# Patient Record
Sex: Female | Born: 1975 | Race: White | Hispanic: No | Marital: Married | Smoking: Never smoker
Health system: Southern US, Community
[De-identification: ages and names within clinical notes are randomized; demographics above are authoritative.]

## PROBLEM LIST (undated history)

## (undated) DIAGNOSIS — K649 Unspecified hemorrhoids: Secondary | ICD-10-CM

---

## 2009-12-27 ENCOUNTER — Inpatient Hospital Stay (HOSPITAL_COMMUNITY): Admission: AD | Admit: 2009-12-27 | Discharge: 2009-12-27 | Payer: Self-pay | Admitting: Obstetrics & Gynecology

## 2009-12-28 ENCOUNTER — Inpatient Hospital Stay (HOSPITAL_COMMUNITY): Admission: AD | Admit: 2009-12-28 | Discharge: 2009-12-31 | Payer: Self-pay | Admitting: Obstetrics

## 2011-01-13 LAB — CBC
HCT: 33.5 % — ABNORMAL LOW (ref 36.0–46.0)
Hemoglobin: 9.5 g/dL — ABNORMAL LOW (ref 12.0–15.0)
MCHC: 32.9 g/dL (ref 30.0–36.0)
MCHC: 33 g/dL (ref 30.0–36.0)
MCV: 82.4 fL (ref 78.0–100.0)
Platelets: 127 10*3/uL — ABNORMAL LOW (ref 150–400)
Platelets: 128 10*3/uL — ABNORMAL LOW (ref 150–400)
RDW: 15.7 % — ABNORMAL HIGH (ref 11.5–15.5)
RDW: 16 % — ABNORMAL HIGH (ref 11.5–15.5)

## 2011-06-24 LAB — OB RESULTS CONSOLE HIV ANTIBODY (ROUTINE TESTING): HIV: NONREACTIVE

## 2011-09-30 ENCOUNTER — Other Ambulatory Visit (HOSPITAL_COMMUNITY): Payer: Self-pay | Admitting: Certified Nurse Midwife

## 2011-09-30 DIAGNOSIS — R772 Abnormality of alphafetoprotein: Secondary | ICD-10-CM

## 2011-09-30 DIAGNOSIS — O09529 Supervision of elderly multigravida, unspecified trimester: Secondary | ICD-10-CM

## 2011-09-30 DIAGNOSIS — Z3689 Encounter for other specified antenatal screening: Secondary | ICD-10-CM

## 2011-10-21 NOTE — L&D Delivery Note (Signed)
Delivery Note At 12:17 PM a viable and healthy female was delivered via Vaginal, Spontaneous Delivery (Presentation:LOA ).  APGAR: 9,9 ; weight pending.   Placenta status: spontaneous,intact . 3 vessel Cord:  with the following complications: none .  Cord pH: na  Anesthesia:  Local  Episiotomy: none Lacerations: second degree Suture Repair: 2.0 vicryl rapide Est. Blood Loss (mL): 300  Mom to postpartum.  Baby to nursery-stable.  Sharaya Boruff J 03/23/2012, 12:29 PM

## 2011-10-23 ENCOUNTER — Ambulatory Visit (HOSPITAL_COMMUNITY)
Admission: RE | Admit: 2011-10-23 | Discharge: 2011-10-23 | Disposition: A | Discharge status: Home / Self Care | Payer: 59 | Source: Ambulatory Visit | Attending: Certified Nurse Midwife | Admitting: Certified Nurse Midwife

## 2011-10-23 DIAGNOSIS — R772 Abnormality of alphafetoprotein: Secondary | ICD-10-CM

## 2011-10-23 DIAGNOSIS — Z3689 Encounter for other specified antenatal screening: Secondary | ICD-10-CM

## 2011-10-23 DIAGNOSIS — O289 Unspecified abnormal findings on antenatal screening of mother: Secondary | ICD-10-CM

## 2011-10-23 DIAGNOSIS — O358XX0 Maternal care for other (suspected) fetal abnormality and damage, not applicable or unspecified: Secondary | ICD-10-CM | POA: Insufficient documentation

## 2011-10-23 DIAGNOSIS — O09529 Supervision of elderly multigravida, unspecified trimester: Secondary | ICD-10-CM | POA: Insufficient documentation

## 2011-10-23 DIAGNOSIS — Z1389 Encounter for screening for other disorder: Secondary | ICD-10-CM | POA: Insufficient documentation

## 2011-10-23 DIAGNOSIS — Z363 Encounter for antenatal screening for malformations: Secondary | ICD-10-CM | POA: Insufficient documentation

## 2011-10-23 NOTE — Progress Notes (Signed)
Genetic Counseling  High-Risk Gestation Note  Appointment Date:  10/23/2011 Referred By: Marlinda Mike, CNM Date of Birth:  1976-08-03 Partner:  Melissa Macias Attending: Rica Koyanagi, MD   Ms. Melissa Macias and her husband, Mr. Melissa Macias, were seen for genetic counseling regarding a maternal age of 36 y.o. and a screen positive risk for Down syndrome based on First trimester screening performed through [facility]. The couple were accompanied by their two young children to today's visit. Ms. Melissa Macias declined the use of medical interpreter, and her husband provided interpretation for today's visit.   They were counseled regarding maternal age and the association with risk for chromosome conditions due to nondisjunction with aging of the ova.   We reviewed chromosomes, nondisjunction, and the associated 1 in 23 risk for fetal aneuploidy related to a maternal age of 21 at [redacted] weeks gestation.  They were counseled that the risk for aneuploidy decreases as gestational age increases, accounting for those pregnancies which spontaneously abort.  We specifically discussed Down syndrome (trisomy 21), trisomies 13 and 23, including the common features and prognoses of each.   We also reviewed Ms. Melissa Macias First trimester screen result and the associated decrease in risk for fetal Down syndrome (1 in 176 to 1 in 219). However, given that 1 in 219 is above the screen's cutoff, this is considered a screen positive result. They were counseled regarding other explanations for a screen positive result including normal variation and differences in maternal metabolism.  In addition, we reviewed the screen adjusted reduction in risks for trisomy 18/13 (1 in 332 to 1 in 6,621).  They understand that first trimester screening provides a pregnancy specific risk for these conditions, but is not considered to be diagnostic.    They were counseled regarding other available screening and diagnostic options including  ultrasound and amniocentesis. They were counseled that 50-80% of fetuses with Down syndrome and up to 90% of fetuses with trisomies 13 and 18, when well visualized, have detectable anomalies or soft markers by ultrasound.  The risks, benefits, and limitations of each of these options were reviewed in detail.  After thoughtful consideration of these options, they elected to proceed with ultrasound, but declined amniocentesis.  A complete detailed ultrasound was performed today.  The ultrasound report will be sent under separate cover. A choroid plexus cyst was visualized at this time. The choroid plexus is an area in the brain where cerebral spinal fluid, the fluid that bathes the brain and spinal cord, is made.  Cysts, or fluid filled sacs, are sometimes found in the choroid plexus of babies both before and after they are born.  Approximately 1% of pregnancies evaluated by ultrasound will show these cysts.  The significance of these cysts remains unclear, although it is believed that in many cases they are a normal variation of development.  It appears that there may be a slightly increased risk of a chromosome condition associated with these cysts when they are seen with other ultrasound findings.  The presence of CPCs increases the risk for Trisomy 18 above the patient's first trimester screening risk of 1 in 6,621 to approximately 1 in 736.  Follow-up ultrasound was planned in 6 weeks to reevaluate fetal anatomy. They understand that screening tests cannot rule out all birth defects or genetic syndromes.  The patient was advised of this limitation and states she still does not want diagnostic testing at this time.    Ms. Melissa Macias was provided with written information regarding cystic fibrosis (CF)  including the carrier frequency and incidence in the Caucasian population, the availability of carrier testing and prenatal diagnosis if indicated. CF is routinely screened for as part of the [place] newborn screening  panel.  She declined testing today.   Both family histories were reported to be negative for birth defects, mental retardation, and known genetic conditions. A detailed family history discussed was declined by the couple given time constraints due to the presence of both of their young children today. Without further information regarding the provided family history, an accurate genetic risk cannot be calculated. Further genetic counseling is warranted if more information is obtained.  Ms. Melissa Macias denied exposure to environmental toxins or chemical agents. She denied the use of alcohol, tobacco or street drugs. She denied significant viral illnesses during the course of her pregnancy. Her medical and surgical histories were noncontributory.     I counseled this couple regarding the above risks and available options.  The approximate face-to-face time with the genetic counselor was 20 minutes.    Quinn Plowman, MS,  Certified Genetic Counselor 10/23/2011

## 2011-10-23 NOTE — Progress Notes (Signed)
Obstetric ultrasound performed today.  Please see report in [facility]. 

## 2011-10-24 ENCOUNTER — Encounter (HOSPITAL_COMMUNITY): Payer: Self-pay

## 2011-10-28 ENCOUNTER — Other Ambulatory Visit: Payer: Self-pay

## 2011-12-04 ENCOUNTER — Ambulatory Visit (HOSPITAL_COMMUNITY)
Admission: RE | Admit: 2011-12-04 | Discharge: 2011-12-04 | Disposition: A | Discharge status: Home / Self Care | Payer: 59 | Source: Ambulatory Visit | Attending: Certified Nurse Midwife | Admitting: Certified Nurse Midwife

## 2011-12-04 DIAGNOSIS — O289 Unspecified abnormal findings on antenatal screening of mother: Secondary | ICD-10-CM

## 2011-12-04 DIAGNOSIS — O09529 Supervision of elderly multigravida, unspecified trimester: Secondary | ICD-10-CM | POA: Insufficient documentation

## 2011-12-04 DIAGNOSIS — Z3689 Encounter for other specified antenatal screening: Secondary | ICD-10-CM | POA: Insufficient documentation

## 2011-12-04 IMAGING — US US OB FOLLOW-UP
1 series · 14 of 28 positions shown · non-contrast
Comparison: none

[Series 1: us ob follow-up · 0.23mm/px · 14 of 89 slices shown]
[im 4/89]
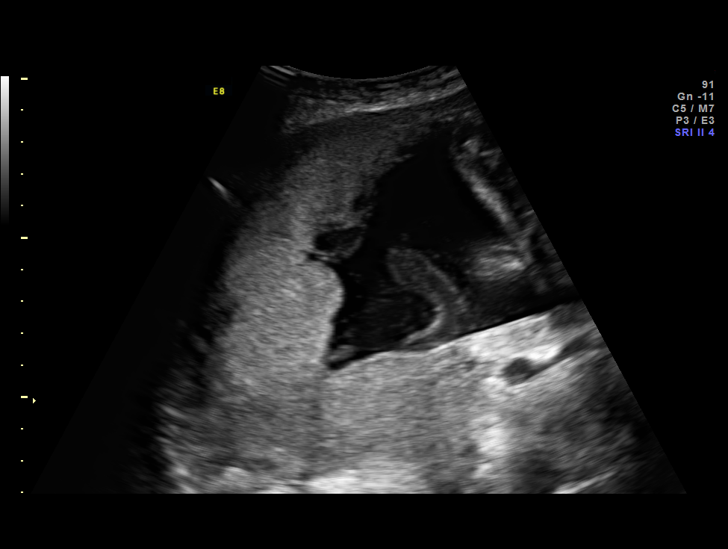
[im 10/89]
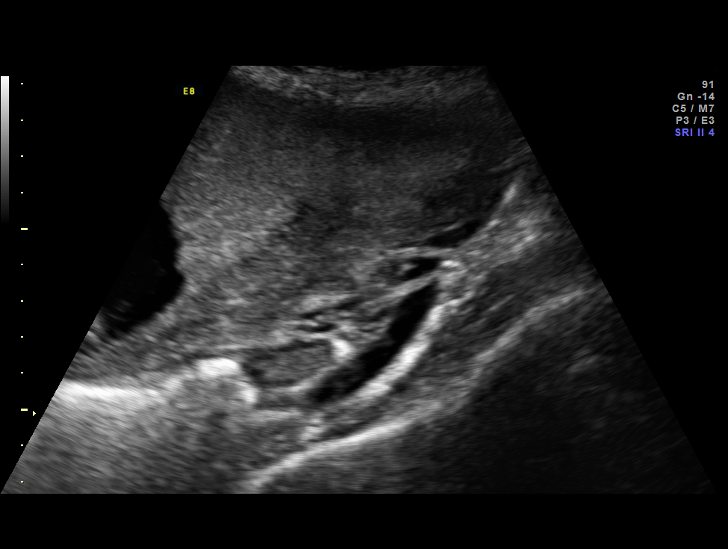
[im 17/89]
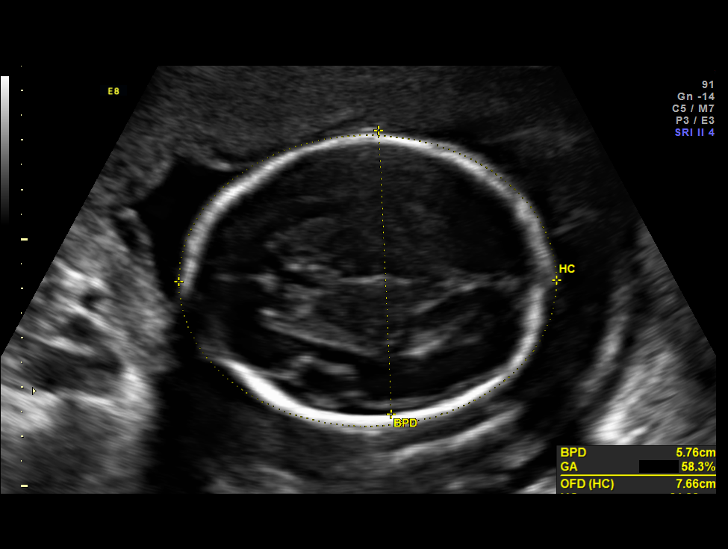
[im 23/89]
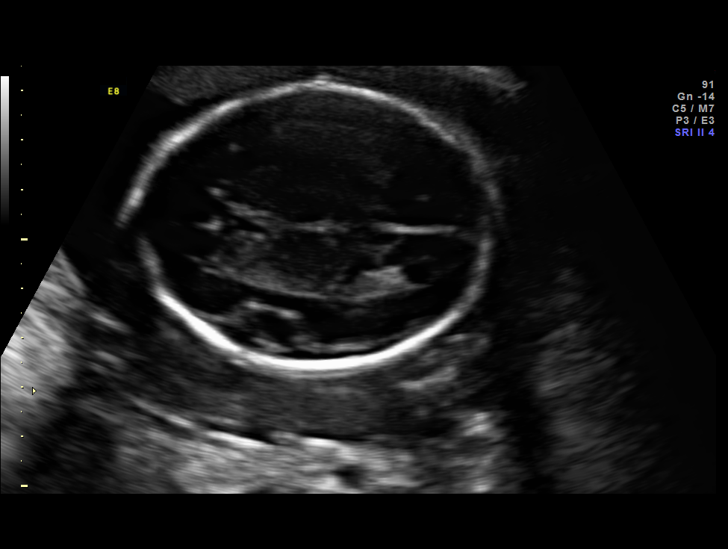
[im 30/89]
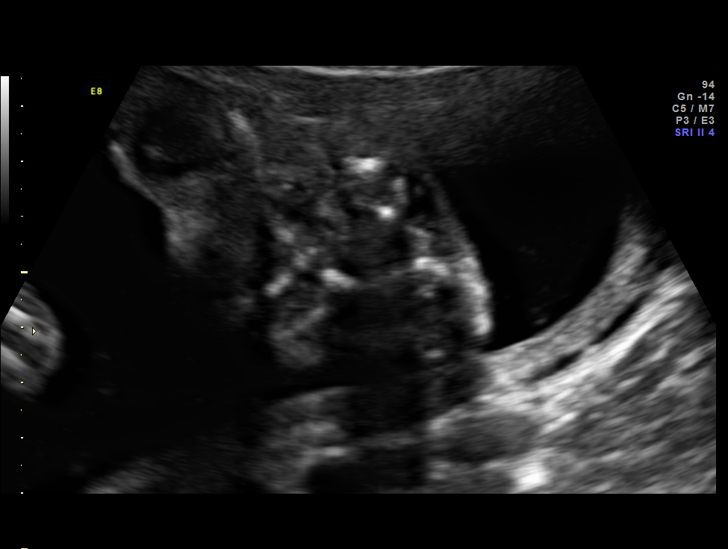
[im 36/89]
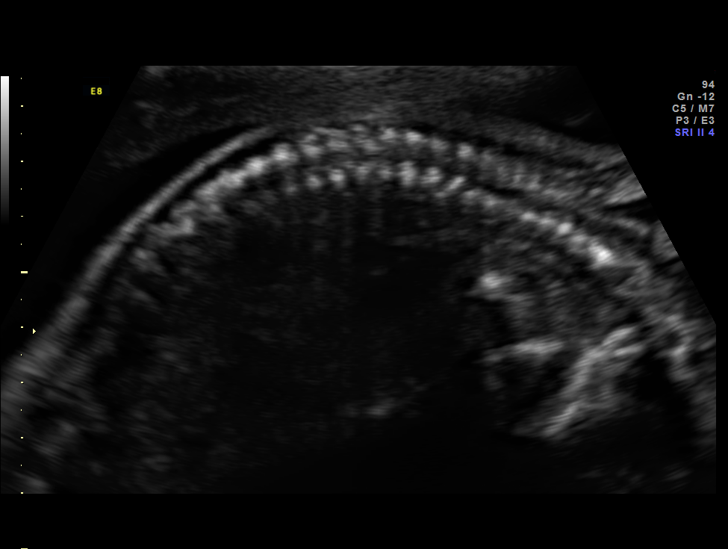
[im 43/89]
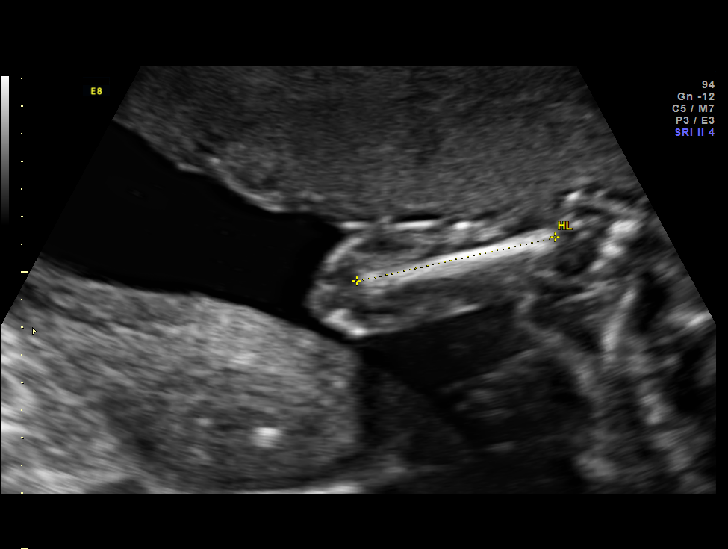
[im 49/89]
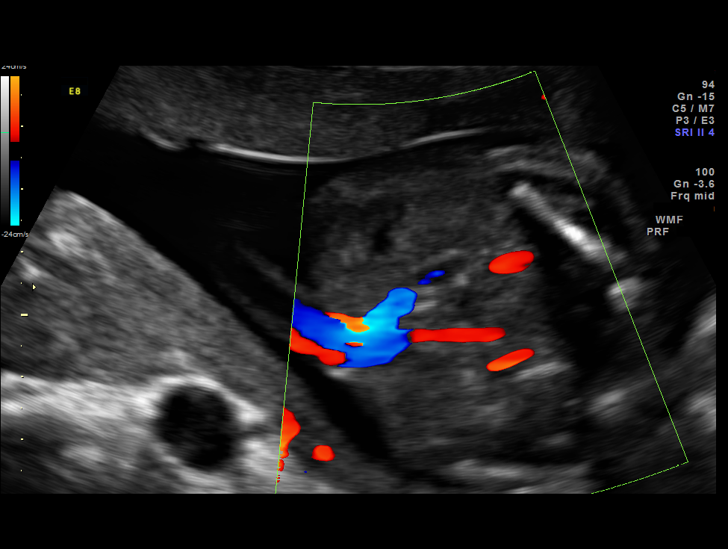
[im 56/89]
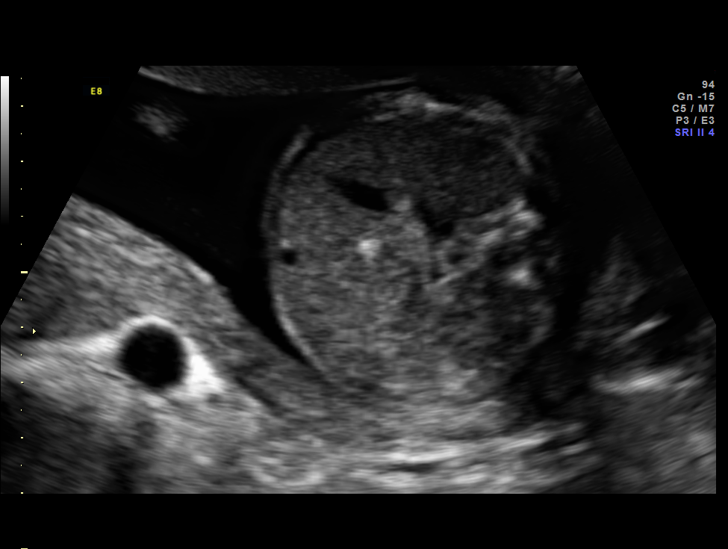
[im 62/89]
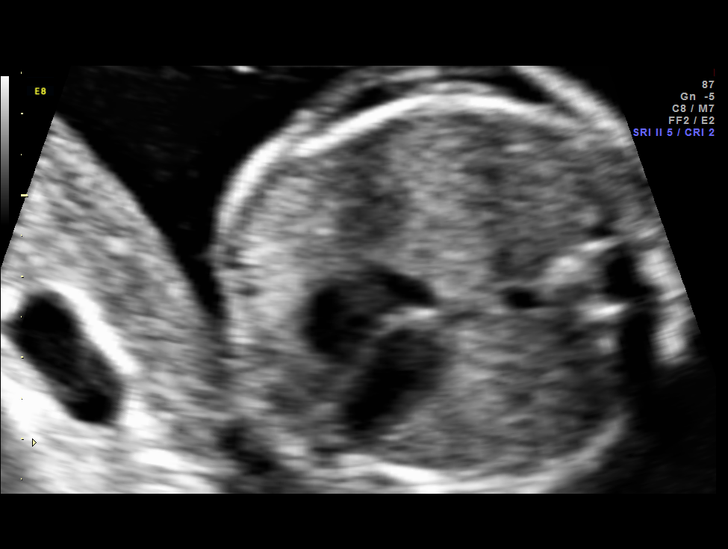
[im 69/89]
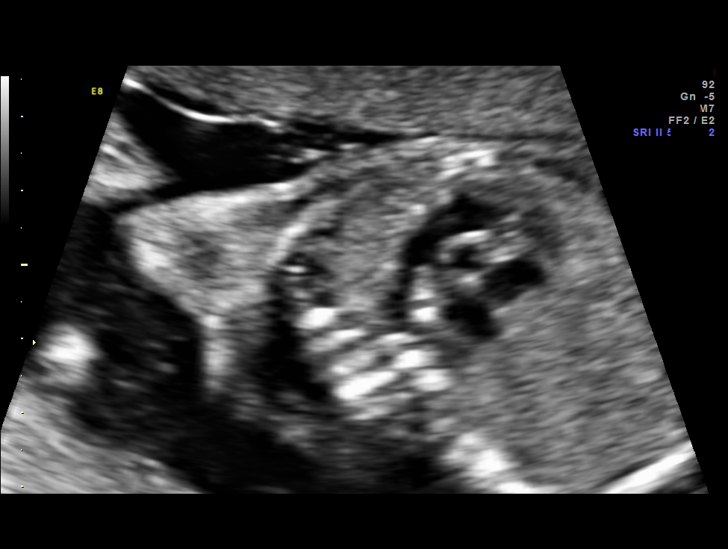
[im 75/89]
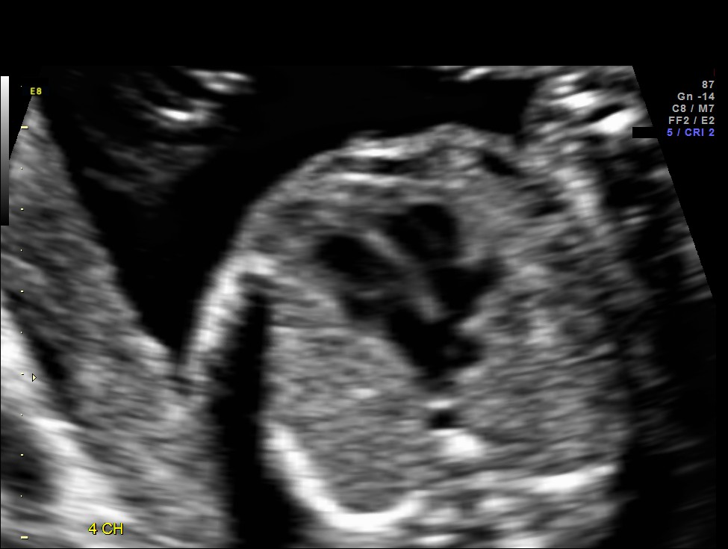
[im 82/89]
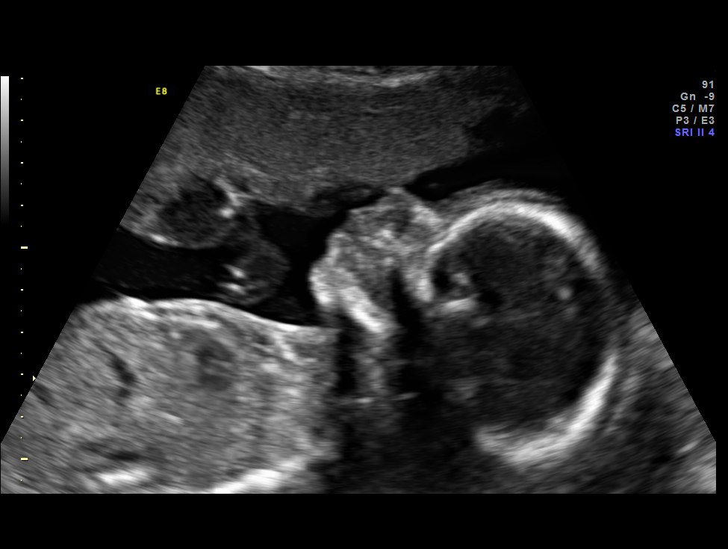
[im 89/89]
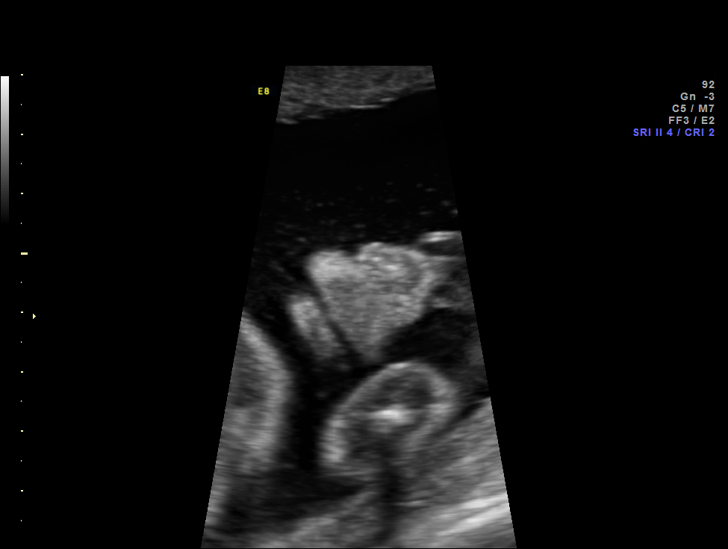

[14 of 28 positions shown; findings below may reference images not displayed]

Canned report from images found in remote index.

Refer to host system for actual result text.

## 2012-01-05 LAB — OB RESULTS CONSOLE ABO/RH: RH Type: POSITIVE

## 2012-03-23 ENCOUNTER — Encounter (HOSPITAL_COMMUNITY): Payer: Self-pay | Admitting: *Deleted

## 2012-03-23 ENCOUNTER — Inpatient Hospital Stay (HOSPITAL_COMMUNITY)
Admission: AD | Admit: 2012-03-23 | Discharge: 2012-03-24 | DRG: 775 | Disposition: A | Discharge status: Home / Self Care | Payer: 59 | Source: Ambulatory Visit | Attending: Obstetrics & Gynecology | Admitting: Obstetrics & Gynecology

## 2012-03-23 ENCOUNTER — Inpatient Hospital Stay (HOSPITAL_COMMUNITY): Admission: AD | Admit: 2012-03-23 | Payer: 59 | Source: Ambulatory Visit | Admitting: Obstetrics & Gynecology

## 2012-03-23 DIAGNOSIS — O09529 Supervision of elderly multigravida, unspecified trimester: Secondary | ICD-10-CM | POA: Diagnosis present

## 2012-03-23 DIAGNOSIS — K649 Unspecified hemorrhoids: Secondary | ICD-10-CM

## 2012-03-23 HISTORY — DX: Unspecified hemorrhoids: K64.9

## 2012-03-23 LAB — CBC
Hemoglobin: 12.4 g/dL (ref 12.0–15.0)
MCH: 29.2 pg (ref 26.0–34.0)
MCHC: 33.3 g/dL (ref 30.0–36.0)
Platelets: 155 10*3/uL (ref 150–400)
RDW: 13.7 % (ref 11.5–15.5)

## 2012-03-23 LAB — RPR: RPR Ser Ql: NONREACTIVE

## 2012-03-23 MED ORDER — WITCH HAZEL-GLYCERIN EX PADS
1.0000 "application " | MEDICATED_PAD | CUTANEOUS | Status: DC | PRN
  Administered 2012-03-23: 1 via TOPICAL

## 2012-03-23 MED ORDER — OXYCODONE-ACETAMINOPHEN 5-325 MG PO TABS
1.0000 | ORAL_TABLET | ORAL | Status: DC | PRN
  Administered 2012-03-23: 1 via ORAL
  Filled 2012-03-23: qty 1

## 2012-03-23 MED ORDER — LANOLIN HYDROUS EX OINT: TOPICAL_OINTMENT | CUTANEOUS | Status: DC | PRN

## 2012-03-23 MED ORDER — OXYTOCIN 20 UNITS IN LACTATED RINGERS INFUSION - SIMPLE: 125.0000 mL/h | INTRAVENOUS | Status: DC

## 2012-03-23 MED ORDER — TETANUS-DIPHTH-ACELL PERTUSSIS 5-2.5-18.5 LF-MCG/0.5 IM SUSP
0.5000 mL | Freq: Once | INTRAMUSCULAR | Status: AC
  Administered 2012-03-24: 0.5 mL via INTRAMUSCULAR
  Filled 2012-03-23: qty 0.5

## 2012-03-23 MED ORDER — ONDANSETRON HCL 4 MG/2ML IJ SOLN: 4.0000 mg | Freq: Four times a day (QID) | INTRAMUSCULAR | Status: DC | PRN

## 2012-03-23 MED ORDER — LIDOCAINE HCL (PF) 1 % IJ SOLN
30.0000 mL | INTRAMUSCULAR | Status: DC | PRN
  Administered 2012-03-23: 30 mL via SUBCUTANEOUS
  Filled 2012-03-23: qty 30

## 2012-03-23 MED ORDER — ONDANSETRON HCL 4 MG/2ML IJ SOLN: 4.0000 mg | INTRAMUSCULAR | Status: DC | PRN

## 2012-03-23 MED ORDER — DIBUCAINE 1 % RE OINT: 1.0000 "application " | TOPICAL_OINTMENT | RECTAL | Status: DC | PRN

## 2012-03-23 MED ORDER — SENNOSIDES-DOCUSATE SODIUM 8.6-50 MG PO TABS
2.0000 | ORAL_TABLET | Freq: Every day | ORAL | Status: DC
  Administered 2012-03-23: 2 via ORAL

## 2012-03-23 MED ORDER — SIMETHICONE 80 MG PO CHEW: 80.0000 mg | CHEWABLE_TABLET | ORAL | Status: DC | PRN

## 2012-03-23 MED ORDER — OXYCODONE-ACETAMINOPHEN 5-325 MG PO TABS: 1.0000 | ORAL_TABLET | ORAL | Status: DC | PRN

## 2012-03-23 MED ORDER — DIPHENHYDRAMINE HCL 25 MG PO CAPS: 25.0000 mg | ORAL_CAPSULE | Freq: Four times a day (QID) | ORAL | Status: DC | PRN

## 2012-03-23 MED ORDER — LACTATED RINGERS IV SOLN: 500.0000 mL | INTRAVENOUS | Status: DC | PRN

## 2012-03-23 MED ORDER — PRENATAL MULTIVITAMIN CH
1.0000 | ORAL_TABLET | Freq: Every day | ORAL | Status: DC
  Administered 2012-03-24: 1 via ORAL
  Filled 2012-03-23: qty 1

## 2012-03-23 MED ORDER — IBUPROFEN 600 MG PO TABS
600.0000 mg | ORAL_TABLET | Freq: Four times a day (QID) | ORAL | Status: DC | PRN
  Administered 2012-03-23: 600 mg via ORAL
  Filled 2012-03-23: qty 1

## 2012-03-23 MED ORDER — BENZOCAINE-MENTHOL 20-0.5 % EX AERO
1.0000 "application " | INHALATION_SPRAY | CUTANEOUS | Status: DC | PRN
  Administered 2012-03-23: 1 via TOPICAL
  Filled 2012-03-23: qty 56

## 2012-03-23 MED ORDER — FLEET ENEMA 7-19 GM/118ML RE ENEM: 1.0000 | ENEMA | RECTAL | Status: DC | PRN

## 2012-03-23 MED ORDER — IBUPROFEN 600 MG PO TABS
600.0000 mg | ORAL_TABLET | Freq: Four times a day (QID) | ORAL | Status: DC
  Administered 2012-03-23 – 2012-03-24 (×4): 600 mg via ORAL
  Filled 2012-03-23 (×4): qty 1

## 2012-03-23 MED ORDER — OXYTOCIN 20 UNITS IN LACTATED RINGERS INFUSION - SIMPLE
125.0000 mL/h | Freq: Once | INTRAVENOUS | Status: AC
  Administered 2012-03-23: 125 mL/h via INTRAVENOUS

## 2012-03-23 MED ORDER — ACETAMINOPHEN 325 MG PO TABS: 650.0000 mg | ORAL_TABLET | ORAL | Status: DC | PRN

## 2012-03-23 MED ORDER — ZOLPIDEM TARTRATE 5 MG PO TABS: 5.0000 mg | ORAL_TABLET | Freq: Every evening | ORAL | Status: DC | PRN

## 2012-03-23 MED ORDER — OXYTOCIN BOLUS FROM INFUSION
500.0000 mL | Freq: Once | INTRAVENOUS | Status: DC
  Filled 2012-03-23: qty 1000
  Filled 2012-03-23: qty 500

## 2012-03-23 MED ORDER — ONDANSETRON HCL 4 MG PO TABS: 4.0000 mg | ORAL_TABLET | ORAL | Status: DC | PRN

## 2012-03-23 MED ORDER — METHYLERGONOVINE MALEATE 0.2 MG PO TABS: 0.2000 mg | ORAL_TABLET | ORAL | Status: DC | PRN

## 2012-03-23 MED ORDER — METHYLERGONOVINE MALEATE 0.2 MG/ML IJ SOLN: 0.2000 mg | INTRAMUSCULAR | Status: DC | PRN

## 2012-03-23 MED ORDER — LACTATED RINGERS IV SOLN
INTRAVENOUS | Status: DC
  Administered 2012-03-23: 125 mL/h via INTRAVENOUS

## 2012-03-23 MED ORDER — CITRIC ACID-SODIUM CITRATE 334-500 MG/5ML PO SOLN: 30.0000 mL | ORAL | Status: DC | PRN

## 2012-03-23 NOTE — Progress Notes (Signed)
Kacey Dysert is a 36 y.o. G3P2002 at [redacted]w[redacted]d by LMP admitted for active labor  Subjective: uncomfortable  Objective: BP 109/70  Pulse 79  Temp(Src) 98.3 F (36.8 C) (Oral)  Resp 18  Ht 5\' 4"  (1.626 m)  Wt 63.957 kg (141 lb)  BMI 24.20 kg/m2  LMP 06/24/2011      FHT:  FHR: 155 bpm, variability: moderate,  accelerations:  Present,  decelerations:  Absent UC:   regular, every 38/100/0 minutes SVE:    8/100/0 Clear AF noted.  Labs: Lab Results  Component Value Date   WBC 9.1 03/23/2012   HGB 12.4 03/23/2012   HCT 37.2 03/23/2012   MCV 87.7 03/23/2012   PLT 155 03/23/2012    Assessment / Plan: Spontaneous labor, progressing normally  Labor: Progressing normally Preeclampsia:  na Fetal Wellbeing:  Category I Pain Control:  Labor support without medications I/D:  n/a Anticipated MOD:  NSVD  Darbie Biancardi J 03/23/2012, 11:35 AM

## 2012-03-23 NOTE — H&P (Signed)
NAME:  Melissa Macias, Melissa Macias NO.:  [id number]  MEDICAL RECORD NO.:  [id number]  LOCATION:  9161                          FACILITY:  [facility]  PHYSICIAN:  Lenoard Aden, M.D.DATE OF BIRTH:  [birth year]  DATE OF ADMISSION:  03/23/2012 DATE OF DISCHARGE:                             HISTORY & PHYSICAL   CHIEF COMPLAINT:  A 36 year old, aerobic female, G3, P2, at 105 weeks' and active labor.  She is a nonsmoker, nondrinker.  She denies domestic or physical violence.  MEDICATIONS:  Include prenatal vitamins.  ALLERGIES:  She has no known drug allergies.  PAST SURGICAL HISTORY:  She has a history of vaginal delivery x2.  FAMILY HISTORY:  Noncontributory.  PRENATAL COURSE:  Uncomplicated.  PHYSICAL EXAMINATION:  GENERAL:  Well-developed, well-nourished, female in no acute distress. HEENT:  Normal. NECK:  Supple.  Full range of motion. LUNGS:  Clear. HEART:  Regular rhythm. ABDOMEN:  Soft, gravid, nontender.  No CVA tenderness. EXTREMITIES:  No cords. NEUROLOGIC:  Nonfocal. SKIN:  Intact. CERVIX:  8 cm, 70% vertex, 0 to +1 station.  Clear fluid noted.  NST is reactive.  IMPRESSION:  Term intrauterine pregnancy in active labor.  PLAN:  Anticipate attempts at vaginal delivery.     Lenoard Aden, M.D.     RJT/MEDQ  D:  03/23/2012  T:  03/23/2012  Job:  [id number]

## 2012-03-24 ENCOUNTER — Encounter (HOSPITAL_COMMUNITY): Payer: Self-pay

## 2012-03-24 LAB — CBC
MCH: 29.1 pg (ref 26.0–34.0)
MCHC: 32.6 g/dL (ref 30.0–36.0)
Platelets: 150 10*3/uL (ref 150–400)
RBC: 3.82 MIL/uL — ABNORMAL LOW (ref 3.87–5.11)

## 2012-03-24 MED ORDER — IBUPROFEN 600 MG PO TABS: 600.0000 mg | ORAL_TABLET | Freq: Four times a day (QID) | ORAL | Status: AC

## 2012-03-24 MED ORDER — PRENATAL MULTIVITAMIN CH: 1.0000 | ORAL_TABLET | Freq: Every day | ORAL | Status: DC

## 2012-03-24 NOTE — Progress Notes (Addendum)
PPD 1 SVD  S:  Reports feeling well.             Tolerating po/ No nausea or vomiting             Bleeding is light             Pain controlled with Motrin             Up ad lib / ambulatory / voiding well.   Newborn  Information for the patient's newborn:  Camie, Hauss [[id number]]  female  breast feeding    O:  A & O x 3 NAD             VS:  Filed Vitals:   03/23/12 1500 03/23/12 1615 03/23/12 2012 03/24/12 0430  BP: 92/56 104/62 100/64 98/60  Pulse: 81 74 78 78  Temp: 98.8 F (37.1 C) 98.4 F (36.9 C) 98.6 F (37 C) 98.1 F (36.7 C)  TempSrc: Oral Oral Oral Oral  Resp: 16 18 16 16   Height:      Weight:      SpO2:   98% 98%    LABS:  Basename 03/24/12 0523 03/23/12 1130  WBC 9.9 9.1  HGB 11.1* 12.4  HCT 34.0* 37.2  PLT 150 155    Blood type: --/--/O POS (06/04 1130)  Rubella: Immune (03/18 0000)     Lungs: Clear and unlabored  Heart: regular rate and rhythm / no murmurs  Abdomen: soft, non-tender, non-distended              Fundus: firm, non-tender, U -1  Perineum: repair intact  Lochia: small  Extremities: no edema, no calf pain or tenderness, neg Homans    A/P: PPD # 1 35 y.o., [id number]    Principal Problem:  *PP - NVB 6/4   Doing well - stable status  Routine post partum orders  Desires early DC / will DC home w/ WOB instructions   Melissa Macias, CNM, MSN 03/24/2012, 10:15 AM

## 2012-03-24 NOTE — Discharge Summary (Signed)
Obstetric Discharge Summary Reason for Admission: advanced dilation 6 cm Prenatal Procedures: ultrasound Intrapartum Procedures: spontaneous vaginal delivery Postpartum Procedures: none Complications-Operative and Postpartum: 2nd degree perineal laceration Hemoglobin  Date Value Range Status  03/24/2012 11.1* 12.0-15.0 (g/dL) Final     HCT  Date Value Range Status  03/24/2012 34.0* 36.0-46.0 (%) Final    Physical Exam:  General: alert, cooperative and no distress Lochia: appropriate Uterine Fundus: firm Incision: healing well DVT Evaluation: Negative Homan's sign. No cords or calf tenderness. No significant calf/ankle edema.  Discharge Diagnoses: Term Pregnancy-delivered  Discharge Information: Date: 03/24/2012 Activity: pelvic rest Diet: routine Medications: PNV and Ibuprofen Condition: stable Instructions: refer to practice specific booklet Discharge to: home Follow-up Information    Follow up with LAVOIE,MARIE-LYNE, MD in 6 weeks.   Contact information:   [address] [place] [address] [phone]          Newborn Data: Live born female "Rahaf" Birth Weight: 7 lb 11.2 oz (3493 g) APGAR: 9, 9  Home with mother.  Talma Aguillard 03/24/2012, 10:35 AM

## 2012-07-06 ENCOUNTER — Emergency Department (HOSPITAL_COMMUNITY): Payer: No Typology Code available for payment source

## 2012-07-06 ENCOUNTER — Emergency Department (HOSPITAL_COMMUNITY)
Admission: EM | Admit: 2012-07-06 | Discharge: 2012-07-06 | Disposition: A | Discharge status: Home / Self Care | Payer: No Typology Code available for payment source | Attending: Emergency Medicine | Admitting: Emergency Medicine

## 2012-07-06 ENCOUNTER — Encounter (HOSPITAL_COMMUNITY): Payer: Self-pay | Admitting: *Deleted

## 2012-07-06 DIAGNOSIS — R079 Chest pain, unspecified: Secondary | ICD-10-CM | POA: Insufficient documentation

## 2012-07-06 DIAGNOSIS — Y93I9 Activity, other involving external motion: Secondary | ICD-10-CM | POA: Insufficient documentation

## 2012-07-06 DIAGNOSIS — T148XXA Other injury of unspecified body region, initial encounter: Secondary | ICD-10-CM

## 2012-07-06 DIAGNOSIS — Y998 Other external cause status: Secondary | ICD-10-CM | POA: Insufficient documentation

## 2012-07-06 DIAGNOSIS — M542 Cervicalgia: Secondary | ICD-10-CM

## 2012-07-06 DIAGNOSIS — IMO0002 Reserved for concepts with insufficient information to code with codable children: Secondary | ICD-10-CM | POA: Insufficient documentation

## 2012-07-06 DIAGNOSIS — M25579 Pain in unspecified ankle and joints of unspecified foot: Secondary | ICD-10-CM | POA: Insufficient documentation

## 2012-07-06 DIAGNOSIS — M25572 Pain in left ankle and joints of left foot: Secondary | ICD-10-CM

## 2012-07-06 DIAGNOSIS — R0789 Other chest pain: Secondary | ICD-10-CM

## 2012-07-06 MED ORDER — IBUPROFEN 800 MG PO TABS: 800.0000 mg | ORAL_TABLET | Freq: Three times a day (TID) | ORAL | Status: DC | PRN
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
## 2012-07-07 NOTE — ED Provider Notes (Signed)
Medical screening examination/treatment/procedure(s) were performed by non-physician practitioner and as supervising physician I was immediately available for consultation/collaboration.  Treasure Ingrum, MD 07/07/12 0029 

## 2014-08-21 ENCOUNTER — Encounter (HOSPITAL_COMMUNITY): Payer: Self-pay | Admitting: *Deleted

## 2017-05-12 ENCOUNTER — Encounter: Payer: Self-pay | Admitting: Obstetrics & Gynecology

## 2017-05-12 ENCOUNTER — Ambulatory Visit (INDEPENDENT_AMBULATORY_CARE_PROVIDER_SITE_OTHER): Payer: Self-pay | Admitting: Obstetrics & Gynecology

## 2017-05-12 VITALS — BP 128/82 | Ht 63.0 in | Wt 142.0 lb

## 2017-05-12 DIAGNOSIS — N921 Excessive and frequent menstruation with irregular cycle: Secondary | ICD-10-CM

## 2017-05-12 DIAGNOSIS — R350 Frequency of micturition: Secondary | ICD-10-CM

## 2017-05-12 LAB — CBC
HCT: 41.9 % (ref 35.0–45.0)
HEMOGLOBIN: 13.9 g/dL (ref 11.7–15.5)
MCH: 29.2 pg (ref 27.0–33.0)
MCHC: 33.2 g/dL (ref 32.0–36.0)
MCV: 88 fL (ref 80.0–100.0)
MPV: 11.3 fL (ref 7.5–12.5)
Platelets: 217 10*3/uL (ref 140–400)
RBC: 4.76 MIL/uL (ref 3.80–5.10)
RDW: 12.7 % (ref 11.0–15.0)
WBC: 5.3 10*3/uL (ref 3.8–10.8)

## 2017-05-12 LAB — URINALYSIS W MICROSCOPIC + REFLEX CULTURE
Bilirubin Urine: NEGATIVE
CASTS: NONE SEEN [LPF]
Glucose, UA: NEGATIVE
Hgb urine dipstick: NEGATIVE
KETONES UR: NEGATIVE
Leukocytes, UA: NEGATIVE
NITRITE: NEGATIVE
Protein, ur: NEGATIVE
RBC / HPF: NONE SEEN RBC/HPF (ref <=2)
Specific Gravity, Urine: 1.02 (ref 1.001–1.035)
YEAST: NONE SEEN [HPF]
pH: 8.5 — ABNORMAL HIGH (ref 5.0–8.0)

## 2017-05-12 LAB — TSH: TSH: 2.18 m[IU]/L

## 2017-05-12 LAB — PREGNANCY, URINE: Preg Test, Ur: NEGATIVE

## 2017-05-12 NOTE — Progress Notes (Signed)
    Melissa LivingFatna Debruhl 1975-11-14 [id number]        41 y.o.  [id number] Married.  From [place].  RP:  Menometrorrhagia with pelvic pain x 3 months  HPI:  Menses heavier, longer and irregular since 01/2017.  No contraception, ok if conceives.  Consulted in [place], but per patient, just had a physical exam, no testing done.  Treated with Amoxyl.  No improvement.  No vaginal d/c.  No fever.  No UTI Sx, except some frequency.  BMs wnl.  Last Pap and Mammo 2 yrs ago.    Past medical history,surgical history, problem list, medications, allergies, family history and social history were all reviewed and documented in the EPIC chart.  Directed ROS with pertinent positives and negatives documented in the history of present illness/assessment and plan.  Exam:  Vitals:   05/12/17 1521  BP: 128/82  Weight: 142 lb (64.4 kg)  Height: 5\' 3"  (1.6 m)   General appearance:  Normal  Abdo:  Soft, NT, no distention  Gyn exam:  Vulva normal                    Speculum:  Normal vaginal secretions and cervix.  Pap reflex/Gono-Chlam done.                    Bimanual exam:  Uterus AV, normal volume, NT, mobile.  No adnexal mass, slightly tender.   Assessment/Plan:  41 y.o. G3P3003   1. Menometrorrhagia Menometro with lower abdominal pain/tenderness x 3 months - Pap IG, CT/NG w/ reflex HPV when ASC-U - CBC - TSH - Prolactin - Pregnancy, urine - US Transvaginal Non-OB; Future  2. Frequency of urination U/A Nit neg, pending Culture. - Urinalysis with Culture Reflex  F/U Pelvic US and Annual/Gyn exam.  Counseling on above issues >50% x 30 minutes.  Genia DelMarie-Lyne Melissa Rosinski MD, 3:31 PM 05/12/2017

## 2017-05-12 NOTE — Patient Instructions (Signed)
1. Menometrorrhagia Menometro with lower abdominal pain/tenderness x 3 months - Pap IG, CT/NG w/ reflex HPV when ASC-U - CBC - TSH - Prolactin - Pregnancy, urine - US Transvaginal Non-OB; Future  2. Frequency of urination U/A Nit neg, pending Culture. - Urinalysis with Culture Reflex  F/U Pelvic US and Annual/Gyn exam  Tashauna, ce fut un plaisir de vous revoir aujourd'hui!  Je vous transmettrai vos resultats aussitot que disponibles.

## 2017-05-13 LAB — PROLACTIN: PROLACTIN: 12.5 ng/mL

## 2017-05-14 LAB — PAP IG, CT-NG, RFX HPV ASCU
CHLAMYDIA PROBE AMP: NOT DETECTED
GC PROBE AMP: NOT DETECTED

## 2017-05-14 LAB — URINE CULTURE: ORGANISM ID, BACTERIA: NO GROWTH

## 2017-05-25 ENCOUNTER — Encounter: Payer: Self-pay | Admitting: Obstetrics & Gynecology

## 2017-05-25 ENCOUNTER — Other Ambulatory Visit: Payer: Self-pay | Admitting: Obstetrics & Gynecology

## 2017-05-25 ENCOUNTER — Ambulatory Visit (INDEPENDENT_AMBULATORY_CARE_PROVIDER_SITE_OTHER): Payer: Self-pay

## 2017-05-25 ENCOUNTER — Ambulatory Visit (INDEPENDENT_AMBULATORY_CARE_PROVIDER_SITE_OTHER): Payer: Self-pay | Admitting: Obstetrics & Gynecology

## 2017-05-25 VITALS — BP 108/66

## 2017-05-25 DIAGNOSIS — R102 Pelvic and perineal pain: Secondary | ICD-10-CM

## 2017-05-25 DIAGNOSIS — N921 Excessive and frequent menstruation with irregular cycle: Secondary | ICD-10-CM

## 2017-05-25 DIAGNOSIS — R938 Abnormal findings on diagnostic imaging of other specified body structures: Secondary | ICD-10-CM

## 2017-05-25 DIAGNOSIS — R9389 Abnormal findings on diagnostic imaging of other specified body structures: Secondary | ICD-10-CM

## 2017-05-25 LAB — PREGNANCY, URINE: Preg Test, Ur: NEGATIVE

## 2017-05-25 NOTE — Progress Notes (Signed)
    Melissa Macias Dec 21, 1975 [id number]        41 y.o.  [id number]  Still breastfeeding youngest baby.  No contraception, ok if conceives.  RP:  Menometrorrhagia with mild pelvic pain for Pelvic US today.  HPI:  Currently well, not bleeding and no pelvic pain.  Seen on 7/24th:  U. Culture neg.  Prl, TSH wnl.  UPT neg.  Pap normal.  Gono-Chlam neg.  Past medical history,surgical history, problem list, medications, allergies, family history and social history were all reviewed and documented in the EPIC chart.  Directed ROS with pertinent positives and negatives documented in the history of present illness/assessment and plan.  Exam:  Vitals:   05/25/17 1255  BP: 108/66   General appearance:  Normal  Pelvic US today:  T/V Anteverted Uterus with fluid within the Endometrium 6 x 2 x 6 mm.  Small echogenic focus in Endometrium 1.7 cm, neg CFD.  Endometrial line otherwise 7.3 mm.  Posterior Subserous Fibroid 3.2 x 2.9 x 3.4 cm.  Tight and Left Ovary normal.  No Cystic areas Right and Left Adnexae.  No FF in CDS.  UPT neg today again.  Assessment/Plan:  41 y.o. G3P3003  1. Menometrorrhagia Patient is breastfeeding.  Work-up negative with Prl/TSH wnl, Gono-Chlam neg, Pap wnl.  Pelvic US today reassuring, except for a possible Endometrial Polyp.  F/U Sonohysto. UPT today neg again.  2. Female pelvic pain No current pelvic pain.  Pelvic US with normal Ovaries.  Counseling >50% x 15 minutes.  Melissa DelMarie-Lyne Thressa Shiffer MD, 12:58 PM 05/25/2017

## 2017-05-25 NOTE — Patient Instructions (Signed)
1. Menometrorrhagia Patient is breastfeeding.  Work-up negative with Prl/TSH wnl, Gono-Chlam neg, Pap wnl.  Pelvic US today reassuring, except for a possible Endometrial Polyp.  F/U Sonohysto. UPT today neg again.  2. Female pelvic pain No current pelvic pain.  Pelvic US with normal Ovaries.  Melissa LivingFatna, it was a pleasure to see you today and I will see you soon for the Sonohysto.

## 2017-05-25 NOTE — Addendum Note (Signed)
Addended by: Berna SpareASTILLO, BLANCA A on: 05/25/2017 03:55 PM   Modules accepted: Orders

## 2017-06-03 ENCOUNTER — Ambulatory Visit: Payer: Self-pay | Admitting: Obstetrics & Gynecology

## 2017-06-03 ENCOUNTER — Other Ambulatory Visit: Payer: Self-pay

## 2021-04-16 ENCOUNTER — Ambulatory Visit: Payer: Self-pay | Admitting: Obstetrics & Gynecology

## 2021-04-30 ENCOUNTER — Encounter: Payer: Self-pay | Admitting: Obstetrics & Gynecology

## 2021-04-30 ENCOUNTER — Ambulatory Visit (INDEPENDENT_AMBULATORY_CARE_PROVIDER_SITE_OTHER): Payer: Self-pay | Admitting: Obstetrics & Gynecology

## 2021-04-30 ENCOUNTER — Other Ambulatory Visit: Payer: Self-pay

## 2021-04-30 VITALS — BP 104/64 | HR 77 | Resp 16 | Ht 63.25 in | Wt 153.0 lb

## 2021-04-30 DIAGNOSIS — Z30011 Encounter for initial prescription of contraceptive pills: Secondary | ICD-10-CM

## 2021-04-30 DIAGNOSIS — N840 Polyp of corpus uteri: Secondary | ICD-10-CM

## 2021-04-30 MED ORDER — NORETHIN ACE-ETH ESTRAD-FE 1-20 MG-MCG PO TABS: 1.0000 | ORAL_TABLET | Freq: Every day | ORAL | 4 refills | Status: DC

## 2021-04-30 NOTE — Progress Notes (Signed)
    Melissa Macias 09-25-1976 [id number]        45 y.o.  [id number]   RP: H/O Endometrial Polyp/contraception  HPI: Pelvic US showed a probable endometrial polyp in 2018.  Had a HSC/Excision of Polyp/D+C in [place] in 2021, patho showed benign endometrial polyp and endometrium.  Menses regular normal every month.  No BTB since the [facility] in 2021.  No pelvic pain.  Currently not on any contraceptive.   OB History  Gravida Para Term Preterm AB Living  4 3 3  0 0 4  SAB IAB Ectopic Multiple Live Births  0 0 0 0 4    # Outcome Date GA Lbr Len/2nd Weight Sex Delivery Anes PTL Lv  4 Term 03/23/12 [redacted]w[redacted]d 08:15 / 00:02 7 lb 11.2 oz (3.493 kg) F Vag-Spont Local  LIV  3 Gravida           2 Term           1 Term             Past medical history,surgical history, problem list, medications, allergies, family history and social history were all reviewed and documented in the EPIC chart.   Directed ROS with pertinent positives and negatives documented in the history of present illness/assessment and plan.  Exam:  Vitals:   04/30/21 0842  BP: 104/64  Pulse: 77  Resp: 16  Weight: 153 lb (69.4 kg)  Height: 5' 3.25" (1.607 m)   General appearance:  Normal  Abdomen: Normal  Gynecologic exam: Vulva normal.  Bimanual exam:  Cervix/vagina normal.  Uterus AV, mobile, normal volume, NT.  No adnexal mass, NT.   Assessment/Plan:  45 y.o. 47   1. Encounter for initial prescription of contraceptive pills Decision to start on contraception given advanced maternal age at 21 which carries the risk of maternal complication and chromosomal abnormalities if she was to become pregnant.  Counseling on contraception done.  Decision to start on a low-dose birth control pill with the generic of Loestrin FE 1/20.  Usage, risks and benefits reviewed thoroughly.  No contraindication.  Prescription sent to pharmacy.  Patient will follow up with an annual gynecologic exam with a Pap test and fasting health labs.  We  will schedule a screening mammogram now.  2. Endometrial polyp Patient had a hysteroscopy with excision of polyps and D&C in 2/20 in 2021.  Pathology confirmed a benign endometrial polyp and benign endometrium.  Other orders - norethindrone-ethinyl estradiol-FE (LOESTRIN FE) 1-20 MG-MCG tablet; Take 1 tablet by mouth daily.   2022 MD, 9:05 AM 04/30/2021

## 2021-08-01 ENCOUNTER — Ambulatory Visit: Payer: Self-pay | Admitting: Obstetrics & Gynecology

## 2022-12-02 ENCOUNTER — Other Ambulatory Visit (HOSPITAL_COMMUNITY)
Admission: RE | Admit: 2022-12-02 | Discharge: 2022-12-02 | Disposition: A | Discharge status: Home / Self Care | Payer: 59 | Source: Ambulatory Visit | Attending: Obstetrics & Gynecology | Admitting: Obstetrics & Gynecology

## 2022-12-02 ENCOUNTER — Encounter: Payer: Self-pay | Admitting: Obstetrics & Gynecology

## 2022-12-02 ENCOUNTER — Ambulatory Visit (INDEPENDENT_AMBULATORY_CARE_PROVIDER_SITE_OTHER): Payer: 59 | Admitting: Obstetrics & Gynecology

## 2022-12-02 VITALS — BP 120/78 | HR 72

## 2022-12-02 DIAGNOSIS — N920 Excessive and frequent menstruation with regular cycle: Secondary | ICD-10-CM | POA: Diagnosis not present

## 2022-12-02 LAB — CBC
HCT: 40.1 % (ref 35.0–45.0)
MCH: 29.1 pg (ref 27.0–33.0)
Platelets: 229 10*3/uL (ref 140–400)
RBC: 4.61 10*6/uL (ref 3.80–5.10)

## 2022-12-02 NOTE — Progress Notes (Signed)
    Melissa Macias Sep 08, 1976 [id number]        47 y.o.  G4P4L4  Husband present.  RP: Menorrhagia  HPI: Menses every month, lasting up to 14 days for the last 6-7 months.  No BTB otherwise.  No post coital bleeding.  No pelvic pain when not bleeding.  C/O hemorrhoids, has used OTC CS creams.  Not currently painful or bleeding.  No Colono done yet, will schedule with Dr Collene Mares.  Declines contraception, ok if conceives.  Last Pap test Neg in 2018.  H/O Endometrial Polyp removed by [facility] in [place] in 2021.   OB History  Gravida Para Term Preterm AB Living  4 4 4  0 0 4  SAB IAB Ectopic Multiple Live Births  0 0 0 0 4    # Outcome Date GA Lbr Len/2nd Weight Sex Delivery Anes PTL Lv  4 Term 03/23/12 [redacted]w[redacted]d 08:15 / 00:02 7 lb 11.2 oz (3.493 kg) F Vag-Spont Local  LIV  3 Term           2 Term           1 Term             Past medical history,surgical history, problem list, medications, allergies, family history and social history were all reviewed and documented in the EPIC chart.   Directed ROS with pertinent positives and negatives documented in the history of present illness/assessment and plan.  Exam:  Vitals:   12/02/22 1035  BP: 120/78  Pulse: 72  SpO2: 99%   General appearance:  Normal  Abdomen: Normal  Gynecologic exam: Vulva normal.  Speculum:  Cervix normal/Vagina normal.  Pap reflex done.  Bimanual exam:  Uterus AV, normal volume, mobile, NT.  No adnexal mass, NT bilaterally.   Assessment/Plan:  47 y.o. G4P4L4   1. Menorrhagia with regular cycle Menses every month, lasting up to 14 days for the last 6-7 months.  No BTB otherwise.  No post coital bleeding.  No pelvic pain when not bleeding.  C/O hemorrhoids, has used OTC CS creams.  Not currently painful or bleeding.  No Colono done yet, will schedule with Dr Collene Mares.  Declines contraception, ok if conceives.  Last Pap test Neg in 2018.  H/O Endometrial Polyp removed by [facility] in [place] in 2021.  Normal Gyn exam today.   Pending Pap reflex and labs.  F/U Pelvic US for further investigation, EBx per findings. - CBC - TSH - FSH - US Transvaginal Non-OB; [facility]   Princess Bruins MD, 10:49 AM 12/02/2022

## 2022-12-03 LAB — CBC
Hemoglobin: 13.4 g/dL (ref 11.7–15.5)
MCHC: 33.4 g/dL (ref 32.0–36.0)
MCV: 87 fL (ref 80.0–100.0)
MPV: 12.5 fL (ref 7.5–12.5)
RDW: 12.5 % (ref 11.0–15.0)
WBC: 4.5 10*3/uL (ref 3.8–10.8)

## 2022-12-03 LAB — TSH: TSH: 2.07 mIU/L

## 2022-12-03 LAB — FOLLICLE STIMULATING HORMONE: FSH: 4.6 m[IU]/mL

## 2022-12-05 LAB — CYTOLOGY - PAP
Comment: NEGATIVE
Diagnosis: NEGATIVE
High risk HPV: NEGATIVE

## 2022-12-08 ENCOUNTER — Telehealth: Payer: Self-pay

## 2022-12-08 NOTE — Telephone Encounter (Signed)
Patient called because scheduled for u/s visit on Thursday and her period has started. She is on Day 2.  Ok to keep appt?

## 2022-12-08 NOTE — Telephone Encounter (Signed)
Called patient and left message in voice mail that I received her message and relayed to Dr. Dellis Filbert who recommends she keep the appointment on Thursday.

## 2022-12-11 ENCOUNTER — Encounter: Payer: Self-pay | Admitting: Obstetrics & Gynecology

## 2022-12-11 ENCOUNTER — Ambulatory Visit (INDEPENDENT_AMBULATORY_CARE_PROVIDER_SITE_OTHER): Payer: 59 | Admitting: Obstetrics & Gynecology

## 2022-12-11 ENCOUNTER — Ambulatory Visit (INDEPENDENT_AMBULATORY_CARE_PROVIDER_SITE_OTHER): Payer: 59

## 2022-12-11 VITALS — BP 108/68 | HR 72

## 2022-12-11 DIAGNOSIS — N920 Excessive and frequent menstruation with regular cycle: Secondary | ICD-10-CM | POA: Diagnosis not present

## 2022-12-11 NOTE — Progress Notes (Signed)
    Melissa Macias [birth year] [id number]        47 y.o.  [id number]   RP: Menorrhagia for pelvic US  HPI: Menses with heavy flow monthly lasting up to 14 days for the last 6-7 months.  No BTB.  No pelvic pain. No contraception, ok if conceives.  Hb 13.4 on 12/02/22.  H/O Endometrial Polyp removed by [facility] in [place] in 2021.    OB History  Gravida Para Term Preterm AB Living  4 4 4 $ 0 0 4  SAB IAB Ectopic Multiple Live Births  0 0 0 0 4    # Outcome Date GA Lbr Len/2nd Weight Sex Delivery Anes PTL Lv  4 Term 03/23/12 [id number]:15 / 00:02 7 lb 11.2 oz (3.493 kg) F Vag-Spont Local  LIV  3 Term           2 Term           1 Term             Past medical history,surgical history, problem list, medications, allergies, family history and social history were all reviewed and documented in the EPIC chart.   Directed ROS with pertinent positives and negatives documented in the history of present illness/assessment and plan.  Exam:  Vitals:   12/11/22 1451  BP: 108/68  Pulse: 72  SpO2: 99%   General appearance:  Normal  Pelvic UKore[place]oday: Comparison is made with previous scan in 2018.  T/V images.  Anteverted uterus normal in size and shape with a single posterior subserosal fibroid which is smaller than on previous scan.  The fibroid is measured at 2.25 cm.  The overall uterine size is measured at 9.12 x 5.44 x 5.87 cm.  The endometrial lining is symmetrical measured at 6.96 mm with no mass or thickening or abnormal blood flow seen.  Both ovaries are normal in size with normal follicular pattern.  No adnexal mass.  No free fluid in the pelvis.   Assessment/Plan:  47y.o. G4P4004   1. Menorrhagia with regular cycle  Menses with heavy flow monthly lasting up to 14 days for the last 6-7 months.  No BTB.  No pelvic pain. No contraception, ok if conceives.  Hb 13.4 on 12/02/22.  [facility]  Pelvic UKoreafindings reviewed.  Normal endometrial line.  Small  SS Fibroid, smaller than previously.  Ovaries normal.  Patient reassured. Declines Progesterone/Progestin treatments.  Declines contraception.  Will take Ibuprofen 800 mg Po every 8 hours during menses.    MPrincess BruinsMD, 3:12 PM 12/11/2022

## 2023-02-05 ENCOUNTER — Encounter: Payer: Self-pay | Admitting: Obstetrics & Gynecology

## 2023-02-05 ENCOUNTER — Ambulatory Visit (INDEPENDENT_AMBULATORY_CARE_PROVIDER_SITE_OTHER): Payer: 59 | Admitting: Obstetrics & Gynecology

## 2023-02-05 VITALS — BP 120/78 | HR 67 | Ht 62.75 in | Wt 152.0 lb

## 2023-02-05 DIAGNOSIS — Z01419 Encounter for gynecological examination (general) (routine) without abnormal findings: Secondary | ICD-10-CM

## 2023-02-05 DIAGNOSIS — Z3009 Encounter for other general counseling and advice on contraception: Secondary | ICD-10-CM

## 2023-02-05 DIAGNOSIS — N920 Excessive and frequent menstruation with regular cycle: Secondary | ICD-10-CM | POA: Diagnosis not present

## 2023-02-05 LAB — TSH: TSH: 2.21 mIU/L

## 2023-02-05 LAB — CBC
HCT: 38.2 % (ref 35.0–45.0)
Hemoglobin: 12.4 g/dL (ref 11.7–15.5)
MCH: 28.5 pg (ref 27.0–33.0)
MCHC: 32.5 g/dL (ref 32.0–36.0)
MCV: 87.8 fL (ref 80.0–100.0)
RBC: 4.35 10*6/uL (ref 3.80–5.10)

## 2023-02-05 NOTE — Progress Notes (Signed)
Melissa Macias 01-28-1976 [id number]   History:    47 y.o. G65P4L4 Married  RP:  Established patient presenting for annual gyn exam   HPI: Menses with heavy flow monthly lasting up to 14 days for the last 6-7 months.  No BTB.  No pelvic pain. No contraception, ok if conceives.  Hb 13.4 on 12/02/22.  H/O Endometrial Polyp removed by [facility] in [place] in 2021. Pelvic US 12/11/22, normal endometrial line.  Small SS Fibroid, smaller than previously.  Ovaries normal.  Declined Progesterone/Progestin treatments.  Declined contraception. Ibuprofen 800 mg Po every 8 hours during menses.  Pap Neg 11/2022.  Breasts normal.  [facility]  BMI 27.14. Colono recommended.  Health labs here today.  Past medical history,surgical history, family history and social history were all reviewed and documented in the EPIC chart.  Gynecologic History Patient's last menstrual period was 01/28/2023 (exact date).  Obstetric History OB History  Gravida Para Term Preterm AB Living  0 0 4  SAB IAB Ectopic Multiple Live Births  0 0 0 0 4    # Outcome Date GA Lbr Len/2nd Weight Sex Delivery Anes PTL Lv  4 Term 03/23/12 [redacted]w[redacted]d 08:15 / 00:02 7 lb 11.2 oz (3.493 kg) F Vag-Spont Local  LIV  3 Term           2 Term           1 Term              ROS: A ROS was performed and pertinent positives and negatives are included in the history. GENERAL: No fevers or chills. HEENT: No change in vision, no earache, sore throat or sinus congestion. NECK: No pain or stiffness. CARDIOVASCULAR: No chest pain or pressure. No palpitations. PULMONARY: No shortness of breath, cough or wheeze. GASTROINTESTINAL: No abdominal pain, nausea, vomiting or diarrhea, melena or bright red blood per rectum. GENITOURINARY: No urinary frequency, urgency, hesitancy or dysuria. MUSCULOSKELETAL: No joint or muscle pain, no back pain, no recent trauma. DERMATOLOGIC: No rash, no itching, no lesions. ENDOCRINE: No polyuria,  polydipsia, no heat or cold intolerance. No recent change in weight. HEMATOLOGICAL: No anemia or easy bruising or bleeding. NEUROLOGIC: No headache, seizures, numbness, tingling or weakness. PSYCHIATRIC: No depression, no loss of interest in normal activity or change in sleep pattern.     Exam:   BP 120/78   Pulse 67   Ht 5' 2.75" (1.594 m)   Wt 152 lb (68.9 kg)   LMP 01/28/2023 (Exact Date) Comment: sexually active-no contraception  SpO2 98%   BMI 27.14 kg/m   Body mass index is 27.14 kg/m.  General appearance : Well developed well nourished female. No acute distress HEENT: Eyes: no retinal hemorrhage or exudates,  Neck supple, trachea midline, no carotid bruits, no thyroidmegaly Lungs: Clear to auscultation, no rhonchi or wheezes, or rib retractions  Heart: Regular rate and rhythm, no murmurs or gallops Breast:Examined in sitting and supine position were symmetrical in appearance, no palpable masses or tenderness,  no skin retraction, no nipple inversion, no nipple discharge, no skin discoloration, no axillary or supraclavicular lymphadenopathy Abdomen: no palpable masses or tenderness, no rebound or guarding Extremities: no edema or skin discoloration or tenderness  Pelvic: Vulva: Normal             Vagina: No gross lesions or discharge  Cervix: No gross lesions or discharge. Mild menstrual blood.    Uterus  AV, normal size, shape  and consistency, non-tender and mobile  Adnexa  Without masses or tenderness  Anus: Normal   Assessment/Plan:  47 y.o. female for annual exam   1. Well female exam with routine gynecological exam Menses with heavy flow monthly lasting up to 14 days for the last 6-7 months.  No BTB.  No pelvic pain. No contraception, ok if conceives.  Hb 13.4 on 12/02/22.  H/O Endometrial Polyp removed by [facility] in [place] in 2021. Pelvic US 12/11/22, normal endometrial line.  Small SS Fibroid, smaller than previously.  Ovaries normal.  Declined Progesterone/Progestin  treatments.  Declined contraception. Ibuprofen 800 mg Po every 8 hours during menses.  Pap Neg 11/2022.  Breasts normal.  [facility]  BMI 27.14. Colono recommended.  Health labs here today. - CBC - Comp Met (CMET) - TSH - Lipid Profile - Vitamin D (25 hydroxy)  2. Menorrhagia with regular cycle Heavy menses stable.  No h/o anemia.  Normal endometrial line on Pelvic US 11/2022.  Will check CBC today.  Declined hormonal control of her cycle.  3. Encounter for other general counseling or advice on contraception  Declined.  Condoms as needed.  [name] MD, 8:50 AM

## 2023-02-06 LAB — COMPREHENSIVE METABOLIC PANEL
AG Ratio: 1.5 (calc) (ref 1.0–2.5)
ALT: 6 U/L (ref 6–29)
AST: 11 U/L (ref 10–35)
Albumin: 4.2 g/dL (ref 3.6–5.1)
Alkaline phosphatase (APISO): 38 U/L (ref 31–125)
BUN: 17 mg/dL (ref 7–25)
CO2: 25 mmol/L (ref 20–32)
Calcium: 9.1 mg/dL (ref 8.6–10.2)
Chloride: 109 mmol/L (ref 98–110)
Creat: 0.64 mg/dL (ref 0.50–0.99)
Globulin: 2.8 g/dL (calc) (ref 1.9–3.7)
Glucose, Bld: 104 mg/dL — ABNORMAL HIGH (ref 65–99)
Potassium: 3.9 mmol/L (ref 3.5–5.3)
Sodium: 139 mmol/L (ref 135–146)
Total Bilirubin: 0.4 mg/dL (ref 0.2–1.2)
Total Protein: 7 g/dL (ref 6.1–8.1)

## 2023-02-06 LAB — LIPID PANEL
Cholesterol: 170 mg/dL (ref <200)
HDL: 47 mg/dL — ABNORMAL LOW (ref >=50)
LDL Cholesterol (Calc): 106 mg/dL (calc) — ABNORMAL HIGH
Non-HDL Cholesterol (Calc): 123 mg/dL (calc) (ref <130)
Total CHOL/HDL Ratio: 3.6 (calc) (ref <5.0)
Triglycerides: 83 mg/dL (ref <150)

## 2023-02-06 LAB — CBC
MPV: 12.8 fL — ABNORMAL HIGH (ref 7.5–12.5)
Platelets: 201 10*3/uL (ref 140–400)
RDW: 12.5 % (ref 11.0–15.0)
WBC: 3.5 10*3/uL — ABNORMAL LOW (ref 3.8–10.8)

## 2023-02-06 LAB — VITAMIN D 25 HYDROXY (VIT D DEFICIENCY, FRACTURES): Vit D, 25-Hydroxy: 10 ng/mL — ABNORMAL LOW (ref 30–100)

## 2023-02-10 ENCOUNTER — Other Ambulatory Visit: Payer: Self-pay

## 2023-02-10 DIAGNOSIS — D72819 Decreased white blood cell count, unspecified: Secondary | ICD-10-CM

## 2023-02-19 ENCOUNTER — Other Ambulatory Visit: Payer: Self-pay

## 2023-02-19 ENCOUNTER — Other Ambulatory Visit: Payer: Self-pay | Admitting: Obstetrics & Gynecology

## 2023-02-19 DIAGNOSIS — R7309 Other abnormal glucose: Secondary | ICD-10-CM

## 2023-02-19 DIAGNOSIS — D72818 Other decreased white blood cell count: Secondary | ICD-10-CM

## 2023-02-19 DIAGNOSIS — E559 Vitamin D deficiency, unspecified: Secondary | ICD-10-CM

## 2023-02-19 DIAGNOSIS — Z1231 Encounter for screening mammogram for malignant neoplasm of breast: Secondary | ICD-10-CM

## 2023-02-19 MED ORDER — VITAMIN D (ERGOCALCIFEROL) 1.25 MG (50000 UNIT) PO CAPS: 50000.0000 [IU] | ORAL_CAPSULE | ORAL | 0 refills | Status: DC

## 2023-02-23 ENCOUNTER — Ambulatory Visit
Admission: RE | Admit: 2023-02-23 | Discharge: 2023-02-23 | Disposition: A | Discharge status: Home / Self Care | Payer: 59 | Source: Ambulatory Visit | Attending: Obstetrics & Gynecology | Admitting: Obstetrics & Gynecology

## 2023-02-23 DIAGNOSIS — Z1231 Encounter for screening mammogram for malignant neoplasm of breast: Secondary | ICD-10-CM

## 2023-03-17 ENCOUNTER — Telehealth: Payer: Self-pay

## 2023-03-17 NOTE — Telephone Encounter (Signed)
Pt LVM in triage line w/o reason for call.   Used interpreter line to CB, no answer, LVMTCB.  Name: Lauralee Evener ID # [id number]

## 2023-03-27 ENCOUNTER — Ambulatory Visit: Payer: 59 | Admitting: Obstetrics & Gynecology

## 2023-04-01 ENCOUNTER — Ambulatory Visit (INDEPENDENT_AMBULATORY_CARE_PROVIDER_SITE_OTHER): Payer: 59 | Admitting: Obstetrics & Gynecology

## 2023-04-01 ENCOUNTER — Encounter: Payer: Self-pay | Admitting: Obstetrics & Gynecology

## 2023-04-01 VITALS — BP 118/80 | HR 85

## 2023-04-01 DIAGNOSIS — R92333 Mammographic heterogeneous density, bilateral breasts: Secondary | ICD-10-CM

## 2023-04-01 NOTE — Progress Notes (Signed)
    Melissa Macias 21-May-1976 [id number]        47 y.o.  [id number]   RP: Consult to discuss mammogram from 02-23-23   HPI: Consult to discuss mammogram from 02-23-23.  Bilateral 3D/CAD screening mammo, Breast Density Category C, B-Rad 1, Negative.  No Breast pain, no lump.  No Fam H/O Breast Ca.   OB History  Gravida Para Term Preterm AB Living  4 4 4  0 0 4  SAB IAB Ectopic Multiple Live Births  0 0 0 0 4    # Outcome Date GA Lbr Len/2nd Weight Sex Delivery Anes PTL Lv  4 Term 03/23/12 [redacted]w[redacted]d 08:15 / 00:02 7 lb 11.2 oz (3.493 kg) F Vag-Spont Local  LIV  3 Term           2 Term           1 Term             Past medical history,surgical history, problem list, medications, allergies, family history and social history were all reviewed and documented in the EPIC chart.   Directed ROS with pertinent positives and negatives documented in the history of present illness/assessment and plan.  Exam:  Vitals:   04/01/23 0952  BP: 118/80  Pulse: 85  SpO2: 97%   General appearance:  Normal  Breast and Gynecologic exams deferred   Assessment/Plan:  47 y.o. G4P4004   1. Mammographic heterogeneous density, bilateral breasts  Consult to discuss mammogram from 02-23-23.  Bilateral 3D/CAD screening mammo, Breast Density Category C, B-Rad 1, Negative.  No Breast pain, no lump.  No Fam H/O Breast Ca.  Mammo results discussed, patient reassured.  Will repeat a Screening Mammo at 1 year.  [name] MD, 10:02 AM 04/01/2023

## 2023-04-20 NOTE — Telephone Encounter (Signed)
No cb. Will close encounter.

## 2023-06-24 ENCOUNTER — Other Ambulatory Visit: Payer: 59

## 2023-06-24 DIAGNOSIS — D72818 Other decreased white blood cell count: Secondary | ICD-10-CM

## 2023-06-24 DIAGNOSIS — D72819 Decreased white blood cell count, unspecified: Secondary | ICD-10-CM

## 2023-06-24 DIAGNOSIS — E559 Vitamin D deficiency, unspecified: Secondary | ICD-10-CM

## 2023-06-24 DIAGNOSIS — R7309 Other abnormal glucose: Secondary | ICD-10-CM

## 2023-06-25 LAB — CBC WITH DIFFERENTIAL/PLATELET
Absolute Monocytes: 331 {cells}/uL (ref 200–950)
Basophils Absolute: 30 {cells}/uL (ref 0–200)
Basophils Relative: 0.7 %
Eosinophils Absolute: 60 {cells}/uL (ref 15–500)
Eosinophils Relative: 1.4 %
HCT: 38.2 % (ref 35.0–45.0)
Hemoglobin: 12.6 g/dL (ref 11.7–15.5)
Lymphs Abs: 1393 {cells}/uL (ref 850–3900)
MCH: 29.1 pg (ref 27.0–33.0)
MCHC: 33 g/dL (ref 32.0–36.0)
MCV: 88.2 fL (ref 80.0–100.0)
MPV: 12.1 fL (ref 7.5–12.5)
Monocytes Relative: 7.7 %
Neutro Abs: 2485 {cells}/uL (ref 1500–7800)
Neutrophils Relative %: 57.8 %
Platelets: 211 10*3/uL (ref 140–400)
RBC: 4.33 10*6/uL (ref 3.80–5.10)
RDW: 12.7 % (ref 11.0–15.0)
Total Lymphocyte: 32.4 %
WBC: 4.3 10*3/uL (ref 3.8–10.8)

## 2023-06-25 LAB — VITAMIN D 25 HYDROXY (VIT D DEFICIENCY, FRACTURES): Vit D, 25-Hydroxy: 10 ng/mL — ABNORMAL LOW (ref 30–100)

## 2023-06-25 LAB — HEMOGLOBIN A1C
Hgb A1c MFr Bld: 5.4 %{Hb} (ref <5.7)
Mean Plasma Glucose: 108 mg/dL
eAG (mmol/L): 6 mmol/L

## 2023-07-13 ENCOUNTER — Telehealth: Payer: Self-pay

## 2023-07-13 DIAGNOSIS — E559 Vitamin D deficiency, unspecified: Secondary | ICD-10-CM

## 2023-07-13 MED ORDER — VITAMIN D (ERGOCALCIFEROL) 1.25 MG (50000 UNIT) PO CAPS
50000.0000 [IU] | ORAL_CAPSULE | ORAL | 0 refills | Status: AC
Start: 2023-07-13 — End: ?

## 2023-07-13 NOTE — Telephone Encounter (Signed)
Pt LVM in triage line inquiring about lab results done on 06/24/2023.   FYI. Former ML pt.   Please advise.

## 2023-07-13 NOTE — Telephone Encounter (Signed)
Per EB:  "Vit D is still very low at <10 So I would recommend taking 50K PO weekly of vit D3 x 4 weeks then resume at 3000 international units daily for maintenance Hga1 was 5.4, which was normal Dr. Karma Greaser"  Pt notified and voiced understanding.  Rx sent.  Pt desired labs sent to her.  Labs sent.   Routing to provider for final review and closing encounter.
